# Patient Record
Sex: Male | Born: 1980 | Race: Black or African American | Hispanic: No | Smoking: Current every day smoker
Health system: Southern US, Community
[De-identification: ages and names within clinical notes are randomized; demographics above are authoritative.]

## PROBLEM LIST (undated history)

## (undated) DIAGNOSIS — B20 Human immunodeficiency virus [HIV] disease: Secondary | ICD-10-CM

## (undated) DIAGNOSIS — I10 Essential (primary) hypertension: Secondary | ICD-10-CM

## (undated) DIAGNOSIS — Z21 Asymptomatic human immunodeficiency virus [HIV] infection status: Secondary | ICD-10-CM

## (undated) HISTORY — DX: Essential (primary) hypertension: I10

## (undated) HISTORY — DX: Asymptomatic human immunodeficiency virus (hiv) infection status: Z21

## (undated) HISTORY — DX: Human immunodeficiency virus (HIV) disease: B20

## (undated) HISTORY — PX: CHOLECYSTECTOMY: SHX55

---

## 2021-03-02 ENCOUNTER — Other Ambulatory Visit: Payer: Self-pay

## 2021-03-02 ENCOUNTER — Emergency Department (HOSPITAL_BASED_OUTPATIENT_CLINIC_OR_DEPARTMENT_OTHER): Payer: 59

## 2021-03-02 ENCOUNTER — Emergency Department (HOSPITAL_BASED_OUTPATIENT_CLINIC_OR_DEPARTMENT_OTHER)
Admission: EM | Admit: 2021-03-02 | Discharge: 2021-03-02 | Disposition: A | Discharge status: Home / Self Care | Payer: 59 | Attending: Emergency Medicine | Admitting: Emergency Medicine

## 2021-03-02 ENCOUNTER — Encounter (HOSPITAL_BASED_OUTPATIENT_CLINIC_OR_DEPARTMENT_OTHER): Payer: Self-pay

## 2021-03-02 DIAGNOSIS — K5229 Other allergic and dietetic gastroenteritis and colitis: Secondary | ICD-10-CM | POA: Insufficient documentation

## 2021-03-02 DIAGNOSIS — F17210 Nicotine dependence, cigarettes, uncomplicated: Secondary | ICD-10-CM | POA: Diagnosis not present

## 2021-03-02 DIAGNOSIS — Z9102 Food additives allergy status: Secondary | ICD-10-CM | POA: Diagnosis not present

## 2021-03-02 DIAGNOSIS — K6289 Other specified diseases of anus and rectum: Secondary | ICD-10-CM | POA: Diagnosis present

## 2021-03-02 DIAGNOSIS — K529 Noninfective gastroenteritis and colitis, unspecified: Secondary | ICD-10-CM

## 2021-03-02 LAB — CBC WITH DIFFERENTIAL/PLATELET
Abs Immature Granulocytes: 0.02 10*3/uL (ref 0.00–0.07)
Basophils Absolute: 0 10*3/uL (ref 0.0–0.1)
Basophils Relative: 0 %
Eosinophils Absolute: 0.1 10*3/uL (ref 0.0–0.5)
Eosinophils Relative: 1 %
HCT: 42.5 % (ref 39.0–52.0)
Hemoglobin: 14.8 g/dL (ref 13.0–17.0)
Immature Granulocytes: 0 %
Lymphocytes Relative: 34 %
Lymphs Abs: 2.3 10*3/uL (ref 0.7–4.0)
MCH: 30.3 pg (ref 26.0–34.0)
MCHC: 34.8 g/dL (ref 30.0–36.0)
MCV: 87.1 fL (ref 80.0–100.0)
Monocytes Absolute: 0.6 10*3/uL (ref 0.1–1.0)
Monocytes Relative: 9 %
Neutro Abs: 3.8 10*3/uL (ref 1.7–7.7)
Neutrophils Relative %: 56 %
Platelets: 177 10*3/uL (ref 150–400)
RBC: 4.88 MIL/uL (ref 4.22–5.81)
RDW: 12.3 % (ref 11.5–15.5)
WBC: 6.8 10*3/uL (ref 4.0–10.5)
nRBC: 0 % (ref 0.0–0.2)

## 2021-03-02 LAB — COMPREHENSIVE METABOLIC PANEL
ALT: 44 U/L (ref 0–44)
AST: 27 U/L (ref 15–41)
Albumin: 4.3 g/dL (ref 3.5–5.0)
Alkaline Phosphatase: 68 U/L (ref 38–126)
Anion gap: 7 (ref 5–15)
BUN: 10 mg/dL (ref 6–20)
CO2: 28 mmol/L (ref 22–32)
Calcium: 9.4 mg/dL (ref 8.9–10.3)
Chloride: 102 mmol/L (ref 98–111)
Creatinine, Ser: 1.33 mg/dL — ABNORMAL HIGH (ref 0.61–1.24)
GFR, Estimated: >60 mL/min (ref >60)
Glucose, Bld: 98 mg/dL (ref 70–99)
Potassium: 3.9 mmol/L (ref 3.5–5.1)
Sodium: 137 mmol/L (ref 135–145)
Total Bilirubin: 1.4 mg/dL — ABNORMAL HIGH (ref 0.3–1.2)
Total Protein: 7.6 g/dL (ref 6.5–8.1)

## 2021-03-02 MED ORDER — DOXYCYCLINE HYCLATE 100 MG PO CAPS: 100.0000 mg | ORAL_CAPSULE | Freq: Two times a day (BID) | ORAL | 0 refills | Status: AC

## 2021-03-02 MED ORDER — SODIUM CHLORIDE 0.9 % IV BOLUS
500.0000 mL | Freq: Once | INTRAVENOUS | Status: AC
  Administered 2021-03-02: 500 mL via INTRAVENOUS

## 2021-03-02 MED ORDER — IOHEXOL 300 MG/ML  SOLN
100.0000 mL | Freq: Once | INTRAMUSCULAR | Status: AC | PRN
  Administered 2021-03-02: 100 mL via INTRAVENOUS

## 2021-03-02 NOTE — ED Notes (Signed)
Patient transported to CT 

## 2021-03-02 NOTE — ED Provider Notes (Signed)
Emergency Department Provider Note   I have reviewed the triage vital signs and the nursing notes.   HISTORY  Chief Complaint Rectal Pain   HPI Stanley Schwartz is a 40 y.o. male presents to the emergency department with rectal pain and discharge after recent sexual encounter.  Patient states that he has had issues with rectal STDs in the past.  He thought that was the issue after recent encounter and went to urgent care where he was treated with Rocephin and doxycycline.  Has been taking the doxycycline for 2 days but continues to have pain where he would typically expect symptoms to have started to resolve by now.  He states he is having some discharge and inability to pass a bowel movement although feels like he needs to.  He denies any pain into his abdomen.  No vomiting.  No fevers.  With symptoms not progressing the way he expects he presents to the ED for evaluation.   History reviewed. No pertinent past medical history.  There are no problems to display for this patient.   Past Surgical History:  Procedure Laterality Date   CHOLECYSTECTOMY      Allergies Amoxicillin  History reviewed. No pertinent family history.  Social History Social History   Tobacco Use   Smoking status: Every Day    Pack years: 0.00    Types: Cigarettes   Smokeless tobacco: Never  Vaping Use   Vaping Use: Never used  Substance Use Topics   Alcohol use: Yes    Comment: socially   Drug use: Never    Review of Systems  Constitutional: No fever/chills Eyes: No visual changes. ENT: No sore throat. Cardiovascular: Denies chest pain. Respiratory: Denies shortness of breath. Gastrointestinal: No abdominal pain.  No nausea, no vomiting.  No diarrhea.  No constipation. Positive rectal pain and discharge.  Genitourinary: Negative for dysuria. Musculoskeletal: Negative for back pain. Skin: Negative for rash. Neurological: Negative for headaches, focal weakness or numbness.  10-point ROS  otherwise negative.  ____________________________________________   PHYSICAL EXAM:  VITAL SIGNS: ED Triage Vitals  Enc Vitals Group     BP 03/02/21 1839 (!) 185/113     Pulse Rate 03/02/21 1839 88     Resp 03/02/21 1839 18     Temp 03/02/21 1839 99.3 F (37.4 C)     Temp Source 03/02/21 1839 Oral     SpO2 03/02/21 1839 100 %     Weight 03/02/21 1838 166 lb (75.3 kg)     Height 03/02/21 1838 5\' 7"  (1.702 m)   Constitutional: Alert and oriented. Well appearing and in no acute distress. Eyes: Conjunctivae are normal. Head: Atraumatic. Nose: No congestion/rhinnorhea. Mouth/Throat: Mucous membranes are moist.   Neck: No stridor.   Cardiovascular: Normal rate, regular rhythm. Good peripheral circulation. Grossly normal heart sounds.   Respiratory: Normal respiratory effort.  No retractions. Lungs CTAB. Gastrointestinal: Soft and nontender. No distention.  Rectal exam performed with patient consent.  No outward sign of trauma, discharge, bleeding.  No hemorrhoids. Musculoskeletal: No lower extremity tenderness nor edema. No gross deformities of extremities. Neurologic:  Normal speech and language. No gross focal neurologic deficits are appreciated.  Skin:  Skin is warm, dry and intact. No rash noted.  ____________________________________________   LABS (all labs ordered are listed, but only abnormal results are displayed)  Labs Reviewed  COMPREHENSIVE METABOLIC PANEL - Abnormal; Notable for the following components:      Result Value   Creatinine, Ser 1.33 (*)  Total Bilirubin 1.4 (*)    All other components within normal limits  RPR - Abnormal; Notable for the following components:   RPR Ser Ql Reactive (*)    All other components within normal limits  CBC WITH DIFFERENTIAL/PLATELET  HIV ANTIBODY (ROUTINE TESTING W REFLEX)  T.PALLIDUM AB, TOTAL    ____________________________________________  RADIOLOGY  CT pelvis with contrast reviewed. No abscess or sign of  perforation.   ____________________________________________   PROCEDURES  Procedure(s) performed:   Procedures  None  ____________________________________________   INITIAL IMPRESSION / ASSESSMENT AND PLAN / ED COURSE  Pertinent labs & imaging results that were available during my care of the patient were reviewed by me and considered in my medical decision making (see chart for details).   Patient presents to the emergency department with rectal pain and discharge.  Clinically seems most consistent with proctocolitis likely related to rectal STD.  He had swabs performed at urgent care 2 days ago and those results are pending.  He is continuing to have pain although has only been on antibiotics for 2 days.  CT pelvis with contrast performed looking for sign of perirectal abscess or small perforation.  The CT is consistent with proctocolitis without complication.  Per CDC guidance, with some purulent drainage the recommendation is to continue doxycycline for 21 days as opposed to 7.  Wrote a prescription for additional 14 days.  Do not feel patient's been taking antibiotics Latasha Buczkowski enough to consider this a failure.  HIV and RPR testing sent as well.    ____________________________________________  FINAL CLINICAL IMPRESSION(S) / ED DIAGNOSES  Final diagnoses:  Proctocolitis     MEDICATIONS GIVEN DURING THIS VISIT:  Medications  sodium chloride 0.9 % bolus 500 mL ( Intravenous Stopped 03/02/21 2115)  iohexol (OMNIPAQUE) 300 MG/ML solution 100 mL (100 mLs Intravenous Contrast Given 03/02/21 2039)     NEW OUTPATIENT MEDICATIONS STARTED DURING THIS VISIT:  Discharge Medication List as of 03/02/2021  9:36 PM     START taking these medications   Details  doxycycline (VIBRAMYCIN) 100 MG capsule Take 1 capsule (100 mg total) by mouth 2 (two) times daily for 14 days., Starting Thu 03/02/2021, Until Thu 03/16/2021, Print        Note:  This document was prepared using Dragon voice  recognition software and may include unintentional dictation errors.  Alona Bene, MD, [facility] Emergency Medicine    Ivonna Kinnick, Arlyss Repress, MD 03/04/21 [phone]

## 2021-03-02 NOTE — Discharge Instructions (Addendum)
You were seen in the ED with rectal inflammation. I will have you continue your antibiotics for the next week. If your symptoms continue, I have filled your prescription for Doxycycline for an additional 2 weeks to take. Please follow closely with your PCP.

## 2021-03-02 NOTE — ED Triage Notes (Signed)
Pt c/o rectal pain after anal intercourse on Sunday. States has had a lot of pressure and hasnt had BM since. States was only able to expel mucus. Went to UC and was given abx. Frequently tested for STD as he is "highly sexual". Denies abdominal pain

## 2021-03-03 LAB — RPR
RPR Ser Ql: REACTIVE — AB
RPR Titer: 1:1 {titer}

## 2021-03-03 LAB — HIV ANTIBODY (ROUTINE TESTING W REFLEX): HIV Screen 4th Generation wRfx: NONREACTIVE

## 2021-03-06 LAB — T.PALLIDUM AB, TOTAL: T Pallidum Abs: REACTIVE — AB

## 2022-06-27 IMAGING — CT CT PELVIS W/ CM
2 of 3 series · 16 of 46 positions shown, 18 images · IV contrast (Omnipaque)
Comparison: None.

CLINICAL DATA: Rectal pain

EXAM:
CT PELVIS WITH CONTRAST
TECHNIQUE: Multidetector CT imaging of the pelvis was performed using the
standard protocol following the bolus administration of intravenous
contrast.
CONTRAST:  100mL OMNIPAQUE IOHEXOL 300 MG/ML  SOLN

[Series 2: axial soft · axial · 0.90mm/px · z∈[-641,-383]mm · 13 of 149 slices shown, 15 images]
[im 10/149  soft-tissue]
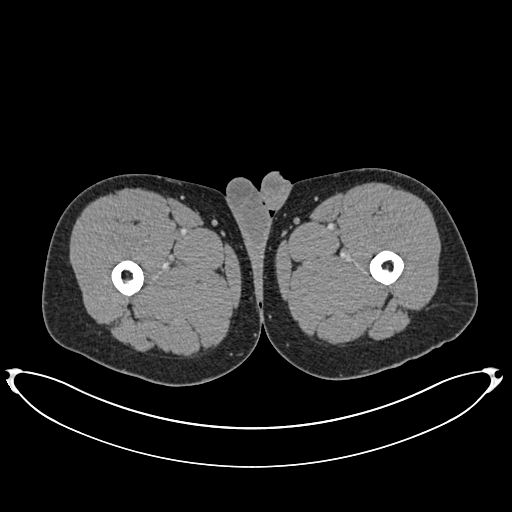
[im 10/149  bone]
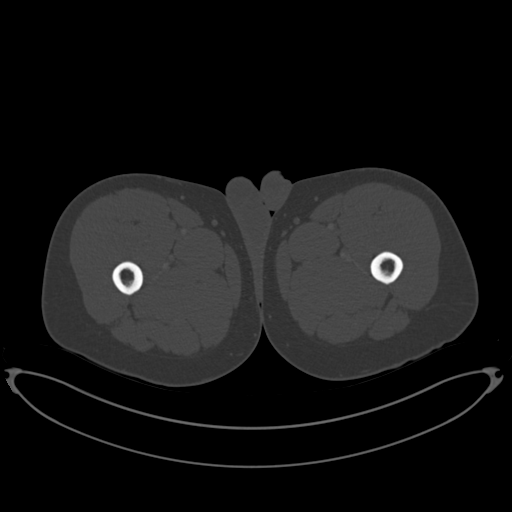
[im 20/149  soft-tissue]
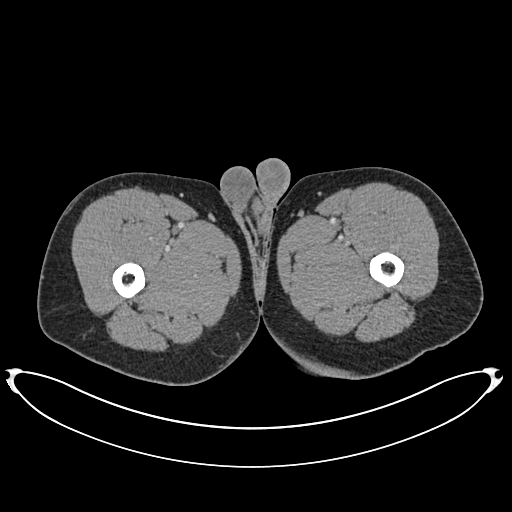
[im 29/149  soft-tissue]
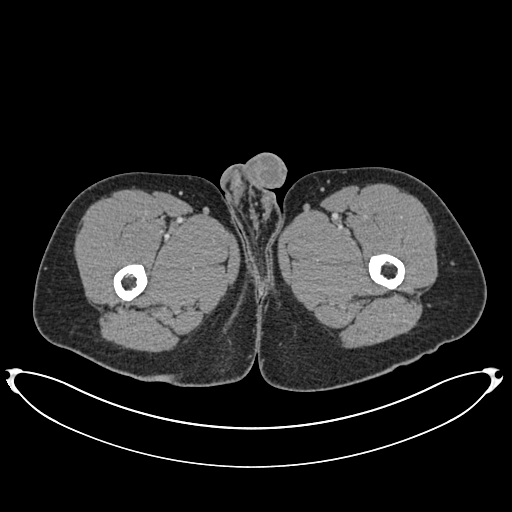
[im 43/149  soft-tissue]
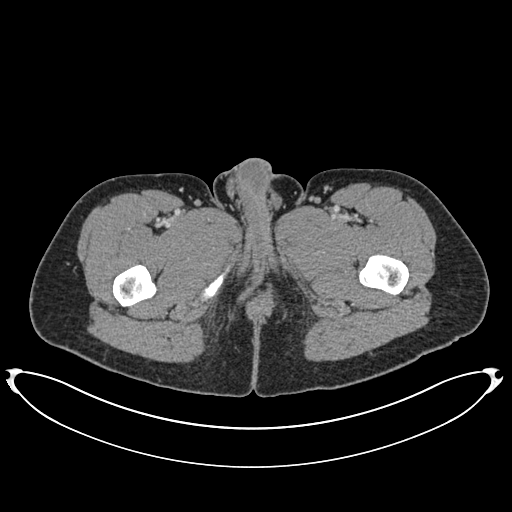
[im 53/149  soft-tissue]
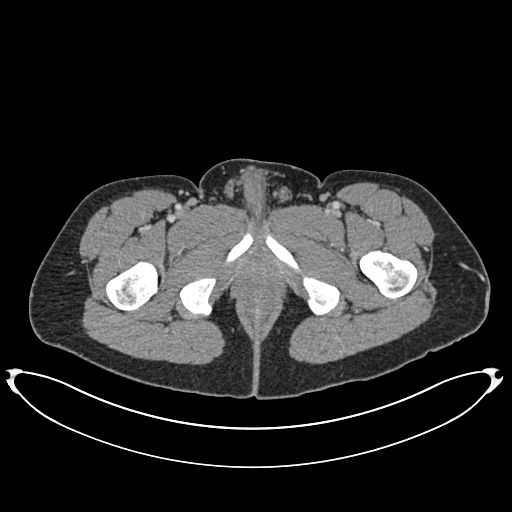
[im 63/149  soft-tissue]
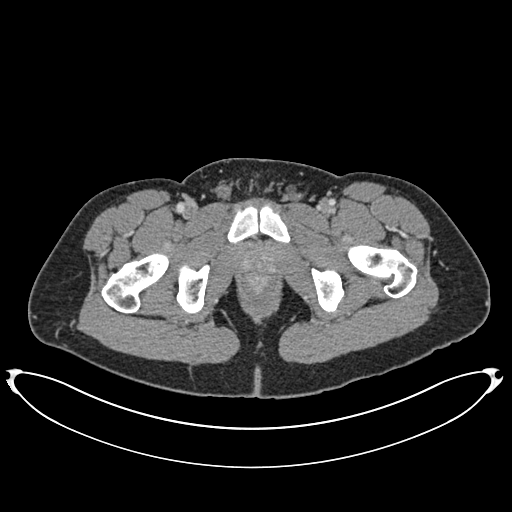
[im 77/149  soft-tissue]
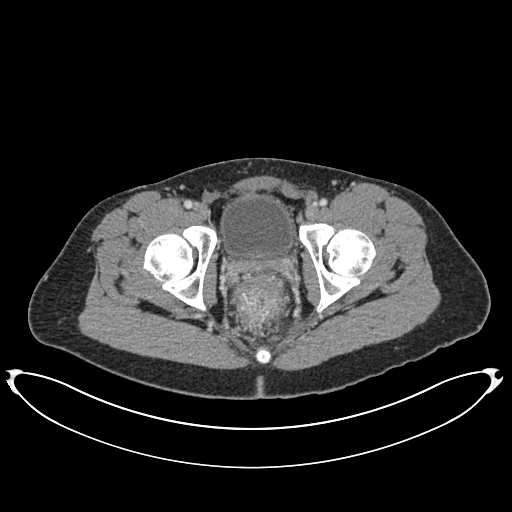
[im 86/149  soft-tissue]
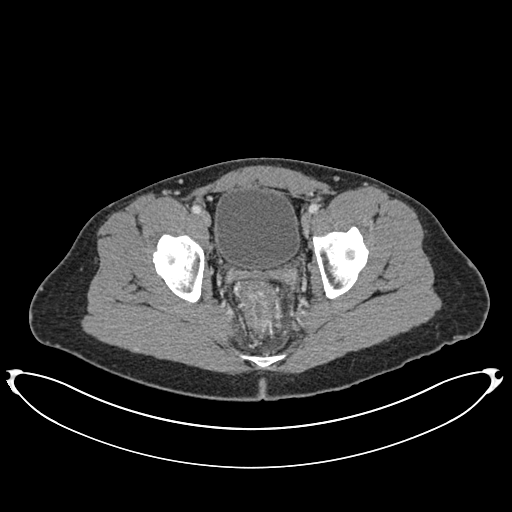
[im 96/149  soft-tissue]
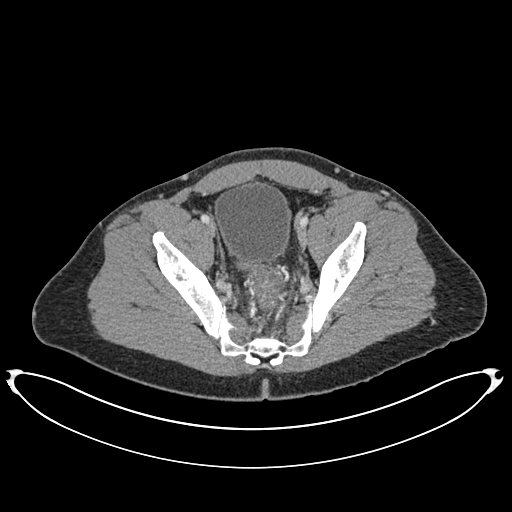
[im 96/149  bone]
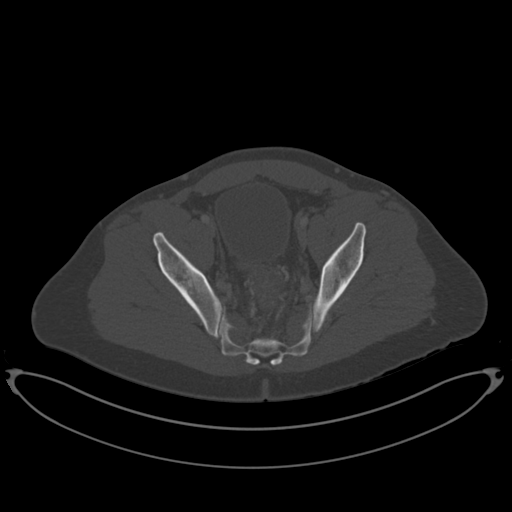
[im 106/149  soft-tissue]
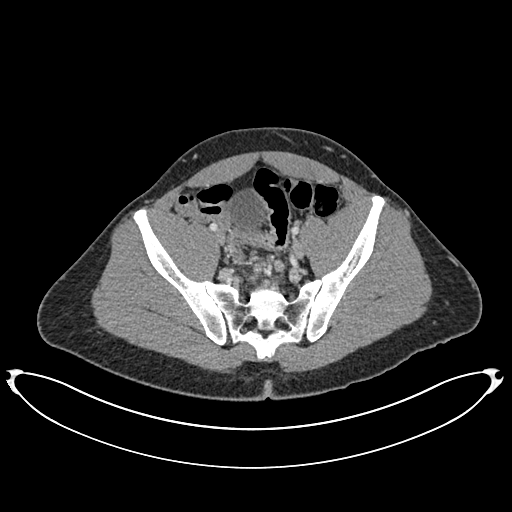
[im 120/149  soft-tissue]
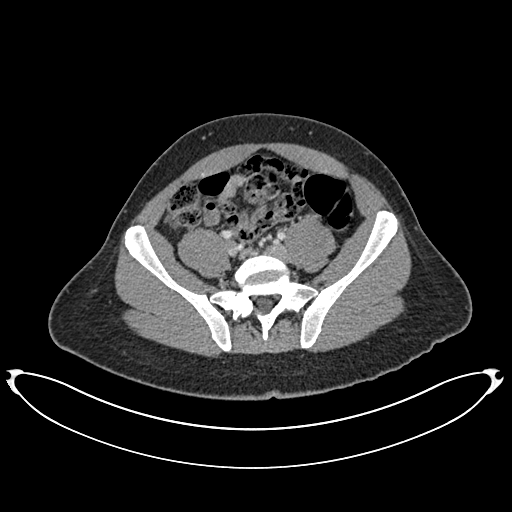
[im 129/149  soft-tissue]
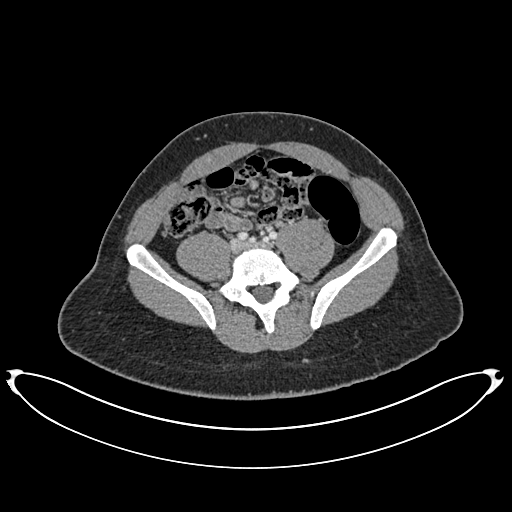
[im 139/149  soft-tissue]
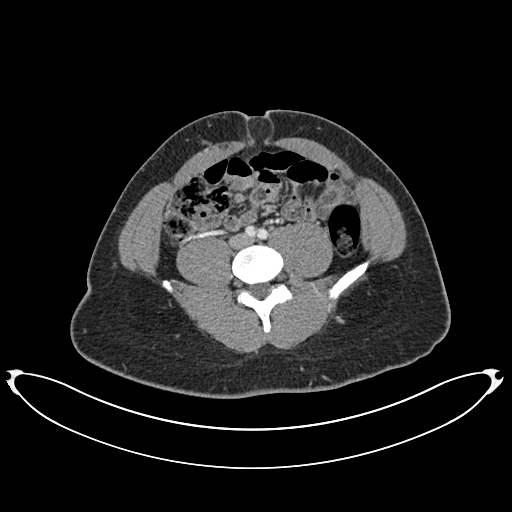

[Series 4: coronal st · coronal · 0.63mm/px · 3 of 140 slices shown]
[im 47/140  soft-tissue]
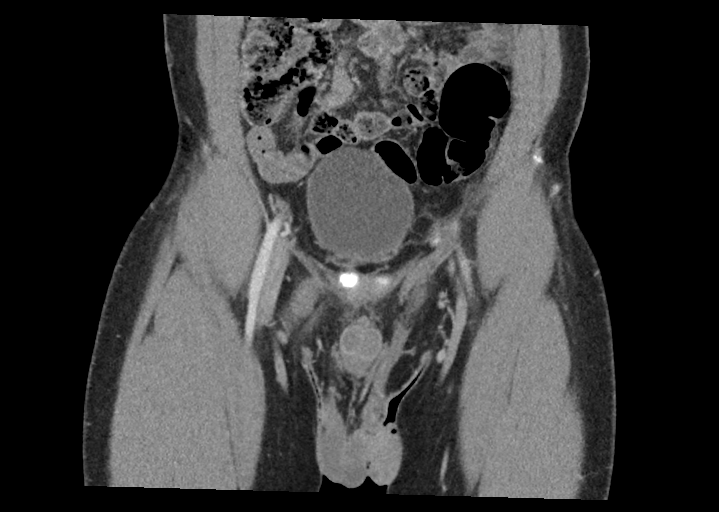
[im 62/140  soft-tissue]
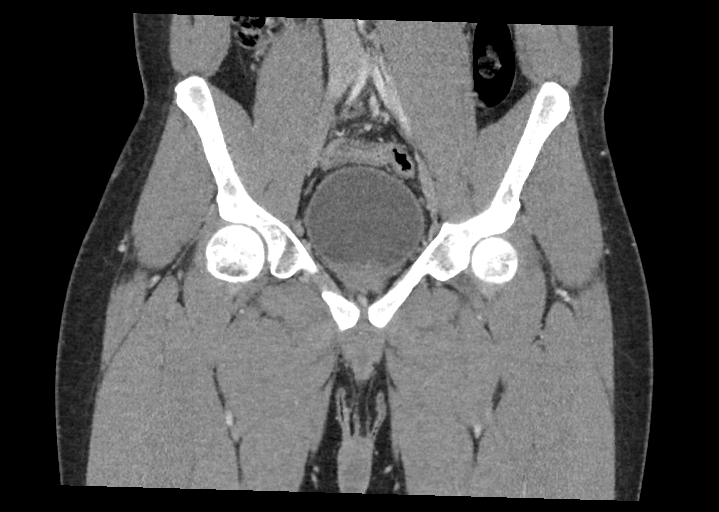
[im 78/140  soft-tissue]
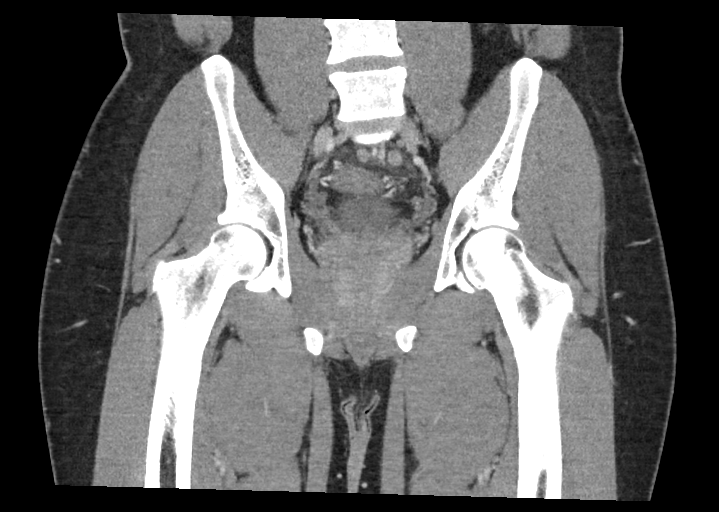

[16 of 46 positions shown; findings below may reference images not displayed]

FINDINGS: Urinary Tract:  No abnormality visualized.

Bowel: Mild sigmoid colon wall thickening. Prominent rectal wall
thickening with inflammation and mucosal enhancement.

Vascular/Lymphatic: No aneurysm. Multiple subcentimeter perirectal
nodes measuring up to 9 mm, series 2, image 45.

Reproductive:  Negative

Other: Negative for free air. Presacral soft tissue stranding and
trace fluid. Small fat containing umbilical hernia.

Musculoskeletal: No suspicious bone lesions identified.
IMPRESSION: Wall thickening, inflammatory change and mucosal enhancement
involving the rectosigmoid colon consistent with proctocolitis. No
definitive perianal or perirectal abscess is seen. There is no
extraluminal gas to suggest a perforation at this time. There are
multiple small perirectal lymph nodes which are probably reactive.

## 2022-09-27 DIAGNOSIS — R5383 Other fatigue: Secondary | ICD-10-CM | POA: Diagnosis not present

## 2022-09-27 DIAGNOSIS — B349 Viral infection, unspecified: Secondary | ICD-10-CM | POA: Diagnosis not present

## 2022-09-27 DIAGNOSIS — H6691 Otitis media, unspecified, right ear: Secondary | ICD-10-CM | POA: Diagnosis not present

## 2022-09-27 DIAGNOSIS — R509 Fever, unspecified: Secondary | ICD-10-CM | POA: Diagnosis not present

## 2022-09-27 DIAGNOSIS — R519 Headache, unspecified: Secondary | ICD-10-CM | POA: Diagnosis not present

## 2022-11-16 ENCOUNTER — Other Ambulatory Visit (HOSPITAL_COMMUNITY): Payer: Self-pay

## 2022-11-16 ENCOUNTER — Telehealth: Payer: Self-pay

## 2022-11-16 NOTE — Telephone Encounter (Signed)
[facility] Patient Teacher, English as a foreign language completed.    The patient is insured through [employer].  Prescription will need to be filled at [facility].  We will continue to follow to see if copay assistance is needed.  Stanley Schwartz, [facility] Patient [facility] for Infectious Disease Phone: [phone] Fax:  [phone]

## 2022-11-19 ENCOUNTER — Encounter: Payer: Self-pay | Admitting: Infectious Diseases

## 2022-11-19 ENCOUNTER — Ambulatory Visit: Payer: Self-pay

## 2022-11-19 ENCOUNTER — Ambulatory Visit: Payer: Self-pay | Admitting: Pharmacist

## 2022-11-20 ENCOUNTER — Ambulatory Visit (INDEPENDENT_AMBULATORY_CARE_PROVIDER_SITE_OTHER): Payer: BC Managed Care – PPO | Admitting: Pharmacist

## 2022-11-20 ENCOUNTER — Ambulatory Visit: Payer: Self-pay

## 2022-11-20 ENCOUNTER — Encounter: Payer: Self-pay | Admitting: Infectious Diseases

## 2022-11-20 ENCOUNTER — Other Ambulatory Visit (HOSPITAL_COMMUNITY): Payer: Self-pay

## 2022-11-20 ENCOUNTER — Ambulatory Visit (INDEPENDENT_AMBULATORY_CARE_PROVIDER_SITE_OTHER): Payer: BC Managed Care – PPO | Admitting: Infectious Diseases

## 2022-11-20 ENCOUNTER — Other Ambulatory Visit: Payer: Self-pay

## 2022-11-20 VITALS — BP 137/86 | HR 84 | Temp 98.0°F | Ht 67.0 in | Wt 175.0 lb

## 2022-11-20 DIAGNOSIS — Z8619 Personal history of other infectious and parasitic diseases: Secondary | ICD-10-CM | POA: Diagnosis not present

## 2022-11-20 DIAGNOSIS — Z21 Asymptomatic human immunodeficiency virus [HIV] infection status: Secondary | ICD-10-CM | POA: Diagnosis not present

## 2022-11-20 DIAGNOSIS — Z8659 Personal history of other mental and behavioral disorders: Secondary | ICD-10-CM

## 2022-11-20 DIAGNOSIS — I10 Essential (primary) hypertension: Secondary | ICD-10-CM

## 2022-11-20 MED ORDER — BICTEGRAVIR-EMTRICITAB-TENOFOV 50-200-25 MG PO TABS: 1.0000 | ORAL_TABLET | Freq: Every day | ORAL | 1 refills | Status: DC

## 2022-11-20 MED ORDER — HYDROCHLOROTHIAZIDE 12.5 MG PO TABS: 12.5000 mg | ORAL_TABLET | Freq: Every day | ORAL | 0 refills | Status: DC

## 2022-11-20 MED ORDER — AMLODIPINE BESYLATE 10 MG PO TABS: 10.0000 mg | ORAL_TABLET | Freq: Every day | ORAL | 0 refills | Status: DC

## 2022-11-20 NOTE — Patient Instructions (Signed)
   Please sign up with MyChart to access your labs and set up email communication with our clinic for non-urgent medical concerns.  Activation Code is good through April - [id number]

## 2022-11-20 NOTE — Progress Notes (Signed)
NEW REFERRAL TO CPP CLINIC    HPI: Stanley Schwartz is a 42 y.o. male who presents to the [facility] clinic for rapid initiation of ART for newly diagnosed HIV infection.  There are no problems to display for this patient.   Patient's Medications  New Prescriptions   No medications on file  Previous Medications   AMLODIPINE (NORVASC) 10 MG TABLET    Take 1 tablet (10 mg total) by mouth daily.   BICTEGRAVIR-EMTRICITABINE-TENOFOVIR AF (BIKTARVY) 50-200-25 MG TABS TABLET    Take 1 tablet by mouth daily. Try to take at the same time each day with or without food.   HYDROCHLOROTHIAZIDE (HYDRODIURIL) 12.5 MG TABLET    Take 1 tablet (12.5 mg total) by mouth daily.  Modified Medications   No medications on file  Discontinued Medications   No medications on file    Allergies: Allergies  Allergen Reactions   Amoxicillin Itching    Past Medical History: No past medical history on file.  Social History: Social History   Socioeconomic History   Marital status: Legally Separated    Spouse name: Not on file   Number of children: Not on file   Years of education: Not on file   Highest education level: Not on file  Occupational History   Not on file  Tobacco Use   Smoking status: Every Day    Packs/day: 0.60    Years: 3.00    Total pack years: 1.80    Types: Cigarettes    Start date: 09/18/2019   Smokeless tobacco: Never  Vaping Use   Vaping Use: Never used  Substance and Sexual Activity   Alcohol use: Yes    Comment: socially   Drug use: Never   Sexual activity: Yes  Other Topics Concern   Not on file  Social History Narrative   Not on file   Social Determinants of Health   Financial Resource Strain: Not on file  Food Insecurity: Not on file  Transportation Needs: Not on file  Physical Activity: Not on file  Stress: Not on file  Social Connections: Not on file    Labs: No results found for: "HIV1RNAQUANT", "HIV1RNAVL", "CD4TABS"  RPR and STI Lab Results  Component  Value Date   LABRPR Reactive (A) 03/02/2021        No data to display          Hepatitis B No results found for: "HEPBSAB", "HEPBSAG", "HEPBCAB" Hepatitis C No results found for: "HEPCAB", "HCVRNAPCRQN" Hepatitis A No results found for: "HAV" Lipids: No results found for: "CHOL", "TRIG", "HDL", "CHOLHDL", "VLDL", "LDLCALC"   Assessment: Stanley Schwartz presents to the clinic to establish care for his newly diagnosed HIV infection. Patient tested positive in February 2024. No labs available yet but will get them drawn today. Medications reviewed; no drug interactions were found. Will start [place].  Explained that Stanley Schwartz is a one pill once daily medication with or without food and the importance of not missing any doses. Explained resistance and how it develops and why it is so important to take Biktarvy daily and not skip days or doses. Counseled patient to take it around the same time each day. Counseled on what to do if dose is missed, if closer to missed dose take immediately, if closer to next dose then skip and resume normal schedule.   Cautioned on possible side effects the first week or so including nausea, diarrhea, dizziness, and headaches but that they should resolve after the first couple of weeks. Counseled  patient to separate Biktarvy from divalent cations including multivitamins. Discussed with patient to call clinic if he starts a new medication or herbal supplement. I gave the patient my card and told him to call me with any issues/questions/concerns.  Biktarvy samples for 2 weeks were provided. He is currently insured and must get his Biktarvy from [place].   Plan: - Biktarvy rapid start - Will follow up on lab work  Gwendolin Briel L. Ocia Simek, PharmD, BCIDP, AAHIVP, CPP Clinical Pharmacist Practitioner Infectious Diseases [place] [facility] for Infectious Disease 11/20/2022, 11:47 AM

## 2022-11-20 NOTE — Progress Notes (Signed)
   Name: Stanley Schwartz  DOB: 09/12/1981 MRN: [id number] PCP: Pcp, No    There are no problems to display for this patient.    Brief Narrative:  Stanley Schwartz is a 42 y.o. male with HIV, Dx 11/01/2022 during routine STI visit at [facility]. Previously on PrEP (T[place]vada >> Descovy) then was not an option d/t insurance decision  CD4 nadir *** VL *** HIV Risk: *** History of OIs: *** Intake Labs ***: Hep B sAg (***), sAb (***), cAb (***); Hep A (***), Hep C (***) Quantiferon (***) HLA B*5701 (***) G6PD: (***)   Previous Regimens: ***  Genotypes: ***  Subjective:   Chief Complaint  Patient presents with  . New Patient (Initial Visit)       HPI: Stanley Schwartz is a 42 y.o. male here today for first visit to discuss HIV treatment.   Was on Descovy when he found out he was HIV (+)  Had a 2 week history   Is taking on HTN medications - Amlodipine and Hydrodil but has been out   H/O Syphilis in the past - was treated about 1 year ago. Recent titer in February 2024 1:2.  ROS  No past medical history on file.  Outpatient Medications Prior to Visit  Medication Sig Dispense Refill  . amLODipine (NORVASC) 10 MG tablet Take 10 mg by mouth daily.    . hydrochlorothiazide (HYDRODIURIL) 12.5 MG tablet Take 12.5 mg by mouth daily.     No facility-administered medications prior to visit.     Allergies  Allergen Reactions  . Amoxicillin Itching    Social History   Tobacco Use  . Smoking status: Every Day    Types: Cigarettes  . Smokeless tobacco: Never  Vaping Use  . Vaping Use: Never used  Substance Use Topics  . Alcohol use: Yes    Comment: socially  . Drug use: Never    No family history on file.  Social History   Substance and Sexual Activity  Sexual Activity Yes     Objective:   Vitals:   11/20/22 0919  BP: 137/86  Pulse: 84  Temp: 98 F (36.7 C)  TempSrc: Temporal  SpO2: 100%  Weight: 175 lb (79.4 kg)  Height: '5\' 7"'$  (1.702 m)   Body mass index is 27.41  kg/m.  Physical Exam  Lab Results Lab Results  Component Value Date   WBC 6.8 03/02/2021   HGB 14.8 03/02/2021   HCT 42.5 03/02/2021   MCV 87.1 03/02/2021   PLT 177 03/02/2021    Lab Results  Component Value Date   CREATININE 1.33 (H) 03/02/2021   BUN 10 03/02/2021   NA 137 03/02/2021   K 3.9 03/02/2021   CL 102 03/02/2021   CO2 28 03/02/2021    Lab Results  Component Value Date   ALT 44 03/02/2021   AST 27 03/02/2021   ALKPHOS 68 03/02/2021   BILITOT 1.4 (H) 03/02/2021    No results found for: "CHOL", "HDL", "LDLCALC", "LDLDIRECT", "TRIG", "CHOLHDL" No results found for: "HIV1RNAQUANT", "HIV1RNAVL", "CD4TABS"   Assessment & Plan:   Problem List Items Addressed This Visit   None   Janene Madeira, MSN, NP-C [facility] Pager: [phone] Office: [phone]  11/20/22  9:27 AM

## 2022-11-21 ENCOUNTER — Encounter: Payer: Self-pay | Admitting: Infectious Diseases

## 2022-11-21 ENCOUNTER — Telehealth: Payer: Self-pay | Admitting: Pharmacist

## 2022-11-21 DIAGNOSIS — B20 Human immunodeficiency virus [HIV] disease: Secondary | ICD-10-CM | POA: Insufficient documentation

## 2022-11-21 DIAGNOSIS — I10 Essential (primary) hypertension: Secondary | ICD-10-CM | POA: Insufficient documentation

## 2022-11-21 DIAGNOSIS — Z21 Asymptomatic human immunodeficiency virus [HIV] infection status: Secondary | ICD-10-CM | POA: Insufficient documentation

## 2022-11-21 DIAGNOSIS — Z8659 Personal history of other mental and behavioral disorders: Secondary | ICD-10-CM | POA: Insufficient documentation

## 2022-11-21 DIAGNOSIS — Z8619 Personal history of other infectious and parasitic diseases: Secondary | ICD-10-CM

## 2022-11-21 HISTORY — DX: Personal history of other infectious and parasitic diseases: Z86.19

## 2022-11-21 LAB — T-HELPER CELLS (CD4) COUNT (NOT AT ARMC)
CD4 % Helper T Cell: 44 % (ref 33–65)
CD4 T Cell Abs: 534 /uL (ref 400–1790)

## 2022-11-21 MED ORDER — BICTEGRAVIR-EMTRICITAB-TENOFOV 50-200-25 MG PO TABS: 1.0000 | ORAL_TABLET | Freq: Every day | ORAL | 0 refills | Status: AC

## 2022-11-21 NOTE — Assessment & Plan Note (Signed)
BP Readings from Last 3 Encounters:  11/20/22 137/86  03/02/21 138/90   Provided him with a refill of amlodipine 10 mg HCTZ 12.5 mg tablets.  He will follow-up on further fills with his primary care team. Tobacco cessation recommended to help with BP control as well.  He is very interested in this and we can talk again at upcoming visits if he needs help/treatment to achieve this.

## 2022-11-21 NOTE — Assessment & Plan Note (Signed)
Recently had comprehensive STI check at the end of February with RPR titer 1:2.  I am not quite sure what his original treatment titer was from 1 year ago.  Will continue to monitor motor 2 times a year for possible reinfection or more frequently if clinically indicated.

## 2022-11-21 NOTE — Assessment & Plan Note (Signed)
New patient here to establish for HIV care.  Recently tested positive for HIV-1 in February 2024 with last negative screening test and December 2023.  Symptoms of what sounds like acute retroviral syndrome in January of this year indicating recent acute infection.  I discussed with Harrel Lemon treatment options/side effects, benefits of treatment and long-term outcomes. I discussed how HIV is transmitted and the process of untreated HIV including increased risk for opportunistic infections, cancer, dementia and renal failure. Patient was counseled on routine HIV care including medication adherence, blood monitoring, necessary vaccines and follow up visits. Counseled regarding safe sex practices including: condom use, partner disclosure if legally implicated, limiting partners. Patient spent time talking with our pharmacist Cassie regarding successful practices of ART and understands to reach out to our clinic in the future with questions.   Will start [place] for HIV treatment.  It sounds like he had a overlap before he was taking Descovy in the setting of acute HIV.  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  Ultimately he is interested in transitioning to long-acting injectable Patient has insurance and co-pay assistance was discussed today.  Medication sent to specialty pharmacy.  General introduction to our clinic and integrated services. No needs identified today. Next visit we can discuss dental referra and begin immunization recommendations.  Will also discuss and offer rectal cancer screening with anal pap smear.  Will see if we can obtain records for previous HPV administrations as he is unsure specifically that vaccination history.  Pertinent labs ordered today including basic chemistry and CBC, hepatitis serologies RPR.  Had comprehensive STI check  Will return to clinic in 4-6  weeks to review lab work and continue HIV education.

## 2022-11-21 NOTE — Assessment & Plan Note (Signed)
Stanley Schwartz has a history of depression not currently active or being treated.  He was on antidepressants for about 2 years and weaned off.  He reports his mood is doing well me updated if there is any changes.

## 2022-11-21 NOTE — Telephone Encounter (Signed)
Medication Samples have been provided to the patient.  Drug name: Biktarvy        Strength: 50/200/25 mg       Qty: 14 tablets (2 bottles) LOT: CPBDCA   Exp.Date: 3/26  Dosing instructions: Take one tablet by mouth once daily  The patient has been instructed regarding the correct time, dose, and frequency of taking this medication, including desired effects and most common side effects.   Alfonse Spruce, PharmD, CPP, BCIDP, [place] Clinical Pharmacist Practitioner Infectious [place] for Infectious Disease

## 2022-11-26 ENCOUNTER — Encounter: Payer: Self-pay | Admitting: Infectious Diseases

## 2022-12-06 LAB — COMPLETE METABOLIC PANEL WITH GFR
AG Ratio: 1.8 (calc) (ref 1.0–2.5)
ALT: 45 U/L (ref 9–46)
AST: 25 U/L (ref 10–40)
Albumin: 4.5 g/dL (ref 3.6–5.1)
Alkaline phosphatase (APISO): 49 U/L (ref 36–130)
BUN: 16 mg/dL (ref 7–25)
CO2: 28 mmol/L (ref 20–32)
Calcium: 9.5 mg/dL (ref 8.6–10.3)
Chloride: 106 mmol/L (ref 98–110)
Creat: 1.23 mg/dL (ref 0.60–1.26)
Globulin: 2.5 g/dL (calc) (ref 1.9–3.7)
Glucose, Bld: 87 mg/dL (ref 65–99)
Potassium: 4.6 mmol/L (ref 3.5–5.3)
Sodium: 141 mmol/L (ref 135–146)
Total Bilirubin: 1 mg/dL (ref 0.2–1.2)
Total Protein: 7 g/dL (ref 6.1–8.1)
eGFR: 77 mL/min/{1.73_m2} (ref >=60)

## 2022-12-06 LAB — HIV-1 GENOTYPE: HIV-1 Genotype: DETECTED — AB

## 2022-12-06 LAB — HEPATITIS B SURFACE ANTIGEN: Hepatitis B Surface Ag: NONREACTIVE

## 2022-12-06 LAB — QUANTIFERON-TB GOLD PLUS
Mitogen-NIL: >10 IU/mL
NIL: 0.09 IU/mL
QuantiFERON-TB Gold Plus: NEGATIVE
TB1-NIL: 0.01 IU/mL
TB2-NIL: <0 IU/mL

## 2022-12-06 LAB — CBC
HCT: 43.2 % (ref 38.5–50.0)
Hemoglobin: 14.6 g/dL (ref 13.2–17.1)
MCH: 30.5 pg (ref 27.0–33.0)
MCHC: 33.8 g/dL (ref 32.0–36.0)
MCV: 90.2 fL (ref 80.0–100.0)
MPV: 10.4 fL (ref 7.5–12.5)
Platelets: 206 10*3/uL (ref 140–400)
RBC: 4.79 10*6/uL (ref 4.20–5.80)
RDW: 12.8 % (ref 11.0–15.0)
WBC: 6.7 10*3/uL (ref 3.8–10.8)

## 2022-12-06 LAB — RPR: RPR Ser Ql: REACTIVE — AB

## 2022-12-06 LAB — HIV RNA, RTPCR W/R GT (RTI, PI,INT)
HIV 1 RNA Quant: 4440 copies/mL — ABNORMAL HIGH
HIV-1 RNA Quant, Log: 3.65 Log copies/mL — ABNORMAL HIGH

## 2022-12-06 LAB — RPR TITER: RPR Titer: 1:1 {titer} — ABNORMAL HIGH

## 2022-12-06 LAB — T PALLIDUM AB: T Pallidum Abs: POSITIVE — AB

## 2022-12-06 LAB — HIV-1 INTEGRASE GENOTYPE

## 2022-12-06 LAB — HEPATITIS B SURFACE ANTIBODY,QUALITATIVE: Hep B S Ab: REACTIVE — AB

## 2022-12-17 ENCOUNTER — Other Ambulatory Visit: Payer: Self-pay | Admitting: Infectious Diseases

## 2022-12-27 ENCOUNTER — Other Ambulatory Visit: Payer: Self-pay

## 2022-12-27 ENCOUNTER — Other Ambulatory Visit (HOSPITAL_COMMUNITY)
Admission: RE | Admit: 2022-12-27 | Discharge: 2022-12-27 | Disposition: A | Discharge status: Home / Self Care | Payer: BC Managed Care – PPO | Source: Ambulatory Visit | Attending: Infectious Diseases | Admitting: Infectious Diseases

## 2022-12-27 ENCOUNTER — Ambulatory Visit: Payer: BC Managed Care – PPO | Admitting: Infectious Diseases

## 2022-12-27 ENCOUNTER — Encounter: Payer: Self-pay | Admitting: Infectious Diseases

## 2022-12-27 VITALS — BP 131/88 | HR 70 | Temp 98.5°F | Wt 178.0 lb

## 2022-12-27 DIAGNOSIS — Z113 Encounter for screening for infections with a predominantly sexual mode of transmission: Secondary | ICD-10-CM

## 2022-12-27 DIAGNOSIS — R198 Other specified symptoms and signs involving the digestive system and abdomen: Secondary | ICD-10-CM | POA: Diagnosis not present

## 2022-12-27 DIAGNOSIS — Z21 Asymptomatic human immunodeficiency virus [HIV] infection status: Secondary | ICD-10-CM | POA: Diagnosis not present

## 2022-12-27 MED ORDER — CEFTRIAXONE SODIUM 500 MG IJ SOLR
500.0000 mg | Freq: Once | INTRAMUSCULAR | Status: AC
  Administered 2022-12-27: 500 mg via INTRAMUSCULAR

## 2022-12-27 NOTE — Assessment & Plan Note (Signed)
[place] with ceftriaxone 500 mg x 1, recommended partner get re-treated then wait 1 week after both are treated for further sex of any kind between them both.  FU oral/rectal and urine testing for further recs if doxy needed.

## 2022-12-27 NOTE — Progress Notes (Signed)
Name: Stanley Schwartz  DOB: [birth year] MRN: [id number] PCP: Pcp, No    Patient Active Problem List   Diagnosis Date Noted   Rectal discharge 12/27/2022   HIV (human immunodeficiency virus infection) 11/21/2022   Hypertension 11/21/2022   History of depression 11/21/2022   History of syphilis 11/21/2022     Brief Narrative:  Stanley Schwartz is a 42 y.o. male with HIV, Dx 11/01/2022 during routine STI visit at [facility]. Previously on PrEP (Truvada >> Descovy) with a lapse by about a few months in 2023 due to insurance changes.  Last negative test was in December 2023 CD4 nadir pending VL pending HIV Risk: Sexual/MSM History of OIs: None Intake Labs 11-2022: Hep B sAg (p), sAb (p), cAb (p); Hep A (p), Hep C (p) Quantiferon (p) HLA B*5701 (p) G6PD: (p)   Previous Regimens: Biktarvy - hx of descovy for PrEP / M184V  Genotypes: Pending 11/2022  Subjective:   Chief Complaint  Patient presents with   Follow-up    Gonorrhea treatment in Feb at [facility]/ rectal itching/ has had unprotected sex since treatment        HPI: Stanley Schwartz is a 42 y.o. male here today for 20m follow up. Has not missed any doses of the Biktarvy. Found the sweet spot moment for taking it daily. No side effects or trouble getting refill so far.   H/O Syphilis in the past - was treated about 1 year ago with injections. Recent titer in February 2024 1:2.  Tx for gonorrhea at [facility] earlier this year. Has had a few new partners, unprotected sex since. More rectal itching/mucous drainage that he doesn't feel has completely resolved. His partner was treated at one point but they may have had intercourse before that was complete. They are exclusive partners.    Review of Systems  Gastrointestinal:        Rectal discharge and discomfort  All other systems reviewed and are negative.    Past Medical History:  Diagnosis Date   History of syphilis 11/21/2022   HIV (human immunodeficiency virus infection)    Hypertension      Outpatient Medications Prior to Visit  Medication Sig Dispense Refill   amLODipine (NORVASC) 10 MG tablet Take 1 tablet (10 mg total) by mouth daily. 30 tablet 0   bictegravir-emtricitabine-tenofovir AF (BIKTARVY) 50-200-25 MG TABS tablet Take 1 tablet by mouth daily. Try to take at the same time each day with or without food. 90 tablet 1   hydrochlorothiazide (HYDRODIURIL) 12.5 MG tablet Take 1 tablet (12.5 mg total) by mouth daily. 30 tablet 0   No facility-administered medications prior to visit.     Allergies  Allergen Reactions   Amoxicillin Itching    Treated with bicillin in 2022/2023 and tolerated well per patient report    Social History   Tobacco Use   Smoking status: Every Day    Packs/day: 0.60    Years: 3.00    Additional pack years: 0.00    Total pack years: 1.80    Types: Cigarettes    Start date: 09/18/2019   Smokeless tobacco: Never  Vaping Use   Vaping Use: Never used  Substance Use Topics   Alcohol use: Yes    Comment: socially   Drug use: Never    Family History  Problem Relation Age of Onset   Heart disease Neg Hx    Cancer Neg Hx     Social History   Substance and Sexual Activity  Sexual Activity Yes  Birth control/protection: None     Objective:   Vitals:   12/27/22 0919  BP: 131/88  Pulse: 70  Temp: 98.5 F (36.9 C)  TempSrc: Oral  SpO2: 100%  Weight: 178 lb (80.7 kg)   Body mass index is 27.88 kg/m.  Physical Exam Vitals reviewed.  Constitutional:      Appearance: He is well-developed.     Comments: Seated comfortably in chair during visit.   HENT:     Mouth/Throat:     Dentition: Normal dentition. No dental abscesses.  Cardiovascular:     Rate and Rhythm: Normal rate and regular rhythm.  Pulmonary:     Effort: Pulmonary effort is normal. No respiratory distress.  Abdominal:     General: There is no distension.     Palpations: Abdomen is soft.     Tenderness: There is no abdominal tenderness.   Lymphadenopathy:     Cervical: No cervical adenopathy.  Skin:    General: Skin is warm and dry.     Findings: No rash.  Neurological:     Mental Status: He is alert and oriented to person, place, and time.  Psychiatric:        Judgment: Judgment normal.     Comments: In good spirits today and engaged in care discussion.      Lab Results Lab Results  Component Value Date   WBC 6.7 11/20/2022   HGB 14.6 11/20/2022   HCT 43.2 11/20/2022   MCV 90.2 11/20/2022   PLT 206 11/20/2022    Lab Results  Component Value Date   CREATININE 1.23 11/20/2022   BUN 16 11/20/2022   NA 141 11/20/2022   K 4.6 11/20/2022   CL 106 11/20/2022   CO2 28 11/20/2022    Lab Results  Component Value Date   ALT 45 11/20/2022   AST 25 11/20/2022   ALKPHOS 68 03/02/2021   BILITOT 1.0 11/20/2022    No results found for: "CHOL", "HDL", "LDLCALC", "LDLDIRECT", "TRIG", "CHOLHDL" HIV 1 RNA Quant (copies/mL)  Date Value  11/20/2022 4,440 (H)   CD4 T Cell Abs (/uL)  Date Value  11/20/2022 534     Assessment & Plan:   Problem List Items Addressed This Visit       Unprioritized   HIV (human immunodeficiency virus infection) - Primary    Doing well so far 40m in on Biktarvy treatment. No s/e and has received refill smoothly. We discussed all previous lab results in detail and answered questions. Hep  A immunity screen and Hep C Ab screen today to complete work up.  Continue biktarvy and recheck VL today for therapeutic response.  Discussed recommended vaccines - he would like to to do these but would like to work with [place] on timing so he can come in before a work break to deal with any unpleasant S/Es.   Recommended:  Prevnar 20 Menveo x 2 doses  HPV x 3 doses   Also discussed anal cancer screening - would like to proceed after current symptoms resolve so we can get a good sample.       Relevant Orders   HIV 1 RNA quant-no reflex-bld   Hepatitis C Antibody (Completed)   Hepatitis A Ab,  Total (Completed)   Cytology (oral, anal, urethral) ancillary only   Urine cytology ancillary only   Rectal discharge    Retreat with ceftriaxone 500 mg x 1, recommended partner get re-treated then wait 1 week after both are treated for further sex of any  kind between them both.  FU oral/rectal and urine testing for further recs if doxy needed.       Relevant Orders   Cytology (oral, anal, urethral) ancillary only   Cytology (oral, anal, urethral) ancillary only   Urine cytology ancillary only   Other Visit Diagnoses     Screening examination for venereal disease       Relevant Orders   Cytology (oral, anal, urethral) ancillary only   Urine cytology ancillary only      Rexene AlbertsStephanie Sagar Tengan, MSN, NP-C [facility] Pager: [phone] Office: [phone]  12/27/22  3:39 PM

## 2022-12-27 NOTE — Patient Instructions (Addendum)
Nice to see you  Keep taking your biktary everyday - stop by the lab so we can see how it's working for you.   Recommended Vaccines -  HPV vaccines (3 given over 6 months) Pneumonia vaccine (once and that's it) Meningitis (2 doses first, then every 5 years a single dose booster).   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   Would like to see you back in 3 months.   Will follow up with you on mychart with your test results.

## 2022-12-27 NOTE — Assessment & Plan Note (Signed)
Doing well so far 15m in on [place] treatment. No s/e and has received refill smoothly. We discussed all previous lab results in detail and answered questions. Hep  A immunity screen and Hep C Ab screen today to complete work up.  Continue biktarvy and recheck VL today for therapeutic response.  Discussed recommended vaccines - he would like to to do these but would like to work with [place] on timing so he can come in before a work break to deal with any unpleasant S/Es.   Recommended:  Prevnar 20 Menveo x 2 doses  HPV x 3 doses   Also discussed anal cancer screening - would like to proceed after current symptoms resolve so we can get a good sample.

## 2022-12-30 LAB — HEPATITIS C ANTIBODY: Hepatitis C Ab: NONREACTIVE

## 2022-12-30 LAB — HIV-1 RNA QUANT-NO REFLEX-BLD
HIV 1 RNA Quant: 56 Copies/mL — ABNORMAL HIGH
HIV-1 RNA Quant, Log: 1.75 Log cps/mL — ABNORMAL HIGH

## 2022-12-30 LAB — HEPATITIS A ANTIBODY, TOTAL: Hepatitis A AB,Total: REACTIVE — AB

## 2023-01-01 LAB — CYTOLOGY, (ORAL, ANAL, URETHRAL) ANCILLARY ONLY
Chlamydia: NEGATIVE
Chlamydia: NEGATIVE
Comment: NEGATIVE
Comment: NEGATIVE
Comment: NORMAL
Comment: NORMAL
Neisseria Gonorrhea: NEGATIVE
Neisseria Gonorrhea: NEGATIVE

## 2023-01-01 LAB — URINE CYTOLOGY ANCILLARY ONLY
Chlamydia: NEGATIVE
Comment: NEGATIVE
Comment: NORMAL
Neisseria Gonorrhea: NEGATIVE

## 2023-04-29 ENCOUNTER — Other Ambulatory Visit: Payer: Self-pay | Admitting: Pharmacist

## 2023-04-29 DIAGNOSIS — Z21 Asymptomatic human immunodeficiency virus [HIV] infection status: Secondary | ICD-10-CM

## 2023-06-07 ENCOUNTER — Other Ambulatory Visit: Payer: Self-pay | Admitting: Infectious Diseases

## 2023-06-07 DIAGNOSIS — Z21 Asymptomatic human immunodeficiency virus [HIV] infection status: Secondary | ICD-10-CM

## 2023-07-03 ENCOUNTER — Other Ambulatory Visit: Payer: Self-pay | Admitting: Infectious Diseases

## 2023-07-03 DIAGNOSIS — Z21 Asymptomatic human immunodeficiency virus [HIV] infection status: Secondary | ICD-10-CM

## 2023-07-03 NOTE — Telephone Encounter (Signed)
Patient needs appointment.

## 2023-07-08 ENCOUNTER — Telehealth: Payer: Self-pay

## 2023-07-08 NOTE — Telephone Encounter (Signed)
Kanen is due for an appointment, called to schedule, no answer. Left HIPAA compliant voicemail requesting callback.   Sandie Ano, RN

## 2023-07-09 ENCOUNTER — Other Ambulatory Visit (HOSPITAL_COMMUNITY): Payer: Self-pay

## 2023-08-05 ENCOUNTER — Ambulatory Visit: Payer: BC Managed Care – PPO | Admitting: Infectious Diseases

## 2023-08-05 ENCOUNTER — Other Ambulatory Visit: Payer: Self-pay | Admitting: Infectious Diseases

## 2023-08-05 DIAGNOSIS — Z21 Asymptomatic human immunodeficiency virus [HIV] infection status: Secondary | ICD-10-CM

## 2023-08-06 NOTE — Telephone Encounter (Signed)
Lvm to call to schedule appt

## 2023-08-12 ENCOUNTER — Other Ambulatory Visit: Payer: Self-pay | Admitting: Infectious Diseases

## 2023-08-12 DIAGNOSIS — Z21 Asymptomatic human immunodeficiency virus [HIV] infection status: Secondary | ICD-10-CM

## 2023-08-27 DIAGNOSIS — R070 Pain in throat: Secondary | ICD-10-CM | POA: Diagnosis not present

## 2023-08-27 DIAGNOSIS — J020 Streptococcal pharyngitis: Secondary | ICD-10-CM | POA: Diagnosis not present

## 2023-08-30 ENCOUNTER — Other Ambulatory Visit: Payer: Self-pay | Admitting: Infectious Diseases

## 2023-08-30 DIAGNOSIS — Z21 Asymptomatic human immunodeficiency virus [HIV] infection status: Secondary | ICD-10-CM

## 2023-09-24 ENCOUNTER — Telehealth: Payer: Self-pay

## 2023-09-24 ENCOUNTER — Other Ambulatory Visit: Payer: Self-pay | Admitting: Infectious Diseases

## 2023-09-24 DIAGNOSIS — Z21 Asymptomatic human immunodeficiency virus [HIV] infection status: Secondary | ICD-10-CM

## 2023-09-24 NOTE — Telephone Encounter (Signed)
 Patient overdue for appointment, no answer. Left HIPAA compliant voicemail requesting callback.   Sandie Ano, RN

## 2023-09-24 NOTE — Telephone Encounter (Signed)
 Overdue for appointment, called to schedule, no answer. Left HIPAA compliant voicemail requesting callback.   Sandie Ano, RN

## 2023-10-03 ENCOUNTER — Ambulatory Visit: Payer: BC Managed Care – PPO | Admitting: Infectious Diseases

## 2023-10-03 ENCOUNTER — Other Ambulatory Visit (HOSPITAL_COMMUNITY)
Admission: RE | Admit: 2023-10-03 | Discharge: 2023-10-03 | Disposition: A | Discharge status: Home / Self Care | Payer: BC Managed Care – PPO | Source: Ambulatory Visit | Attending: Infectious Diseases | Admitting: Infectious Diseases

## 2023-10-03 ENCOUNTER — Encounter: Payer: Self-pay | Admitting: Infectious Diseases

## 2023-10-03 ENCOUNTER — Other Ambulatory Visit: Payer: Self-pay

## 2023-10-03 VITALS — BP 126/86 | HR 91 | Temp 98.0°F | Ht 67.0 in | Wt 180.0 lb

## 2023-10-03 DIAGNOSIS — Z113 Encounter for screening for infections with a predominantly sexual mode of transmission: Secondary | ICD-10-CM

## 2023-10-03 DIAGNOSIS — C20 Malignant neoplasm of rectum: Secondary | ICD-10-CM

## 2023-10-03 DIAGNOSIS — Z21 Asymptomatic human immunodeficiency virus [HIV] infection status: Secondary | ICD-10-CM

## 2023-10-03 DIAGNOSIS — B20 Human immunodeficiency virus [HIV] disease: Secondary | ICD-10-CM | POA: Diagnosis not present

## 2023-10-03 DIAGNOSIS — I10 Essential (primary) hypertension: Secondary | ICD-10-CM | POA: Diagnosis not present

## 2023-10-03 MED ORDER — BIKTARVY 50-200-25 MG PO TABS: 1.0000 | ORAL_TABLET | Freq: Every day | ORAL | 11 refills | Status: DC

## 2023-10-03 MED ORDER — AMLODIPINE BESYLATE 10 MG PO TABS: 10.0000 mg | ORAL_TABLET | Freq: Every day | ORAL | 5 refills | Status: DC

## 2023-10-03 NOTE — Patient Instructions (Addendum)
  140/90 is your blood pressure goal.  Would like to see how your blood pressure looks on the Amlodipine alone - start this pill back once a day with your biktarvy.   You may beneift from a cholesterol medication called CRESTOR - will update you when we have all your labs back to decide what's best.    Will see you back in 4 months to check in on blood pressure on amlodipine alone

## 2023-10-03 NOTE — Progress Notes (Signed)
Name: Stanley Schwartz  DOB: 08-Jul-1981 MRN: [id number] PCP: Pcp, No    Brief Narrative:  Stanley Schwartz is a 43 y.o. male with HIV, Dx 11/01/2022 during routine STI visit at [facility]. Previously on PrEP (Truvada >> Descovy) with a lapse by about a few months in 2023 due to insurance changes.  Last negative test was in December 2023 CD4 nadir pending VL pending HIV Risk: Sexual/MSM History of OIs: None Intake Labs 11-2022: Hep B sAg (p), sAb (p), cAb (p); Hep A (p), Hep C (p) Quantiferon (p) HLA B*5701 (p) G6PD: (p)   Previous Regimens: Biktarvy - hx of descovy for PrEP / M184V  Genotypes: Pending 11/2022  Subjective   Subjective:   Chief Complaint  Patient presents with   Follow-up    Discussed the use of AI scribe software for clinical note transcription with the patient, who gave verbal consent to proceed.  History of Present Illness   Stanley Schwartz, with a history of hypertension and HIV, presents for a routine follow-up. He stopped his antihypertensive medications due to side effects, including frequent urination and just in general wondering if he needed them anymore. He has the ability to check blood pressures at home He expresses willingness to restart antihypertensive therapy, specifically requesting a medication that does not induce urination.  The patient also reports inconsistent timing in taking his HIV medication, Biktarvy, due to fluctuations in his work schedule. He expresses concern about the potential impact of this inconsistency on his health, but denies any current symptoms related to his HIV status.  The patient denies any other health concerns at this time. He has not been experiencing any symptoms suggestive of sexually transmitted infections, but agrees to routine sexual health screenings. He also consents to an anal Pap smear for HPV screening, acknowledging a history of rectal sex.  The patient declines the offer of a flu shot, citing past experiences of feeling unwell  after vaccinations. He expresses a preference for avoiding vaccines unless deemed absolutely necessary for his health.       Review of Systems  Constitutional:  Negative for chills and fever.  HENT:  Negative for tinnitus.   Eyes:  Negative for blurred vision and photophobia.  Respiratory:  Negative for cough and sputum production.   Cardiovascular:  Negative for chest pain.  Gastrointestinal:  Negative for diarrhea, nausea and vomiting.  Genitourinary:  Negative for dysuria.  Skin:  Negative for rash.  Neurological:  Negative for headaches.     Past Medical History:  Diagnosis Date   History of syphilis 11/21/2022   HIV (human immunodeficiency virus infection) (HCC)    Hypertension     Outpatient Medications Prior to Visit  Medication Sig Dispense Refill   BIKTARVY 50-200-25 MG TABS tablet TAKE 1 TABLET BY MOUTH 1 TIME A DAY 30 tablet 0   amLODipine (NORVASC) 10 MG tablet Take 1 tablet (10 mg total) by mouth daily. (Patient not taking: Reported on 10/03/2023) 30 tablet 0   hydrochlorothiazide (HYDRODIURIL) 12.5 MG tablet Take 1 tablet (12.5 mg total) by mouth daily. (Patient not taking: Reported on 10/03/2023) 30 tablet 0   No facility-administered medications prior to visit.     Allergies  Allergen Reactions   Amoxicillin Itching    Treated with bicillin in 2022/2023 and tolerated well per patient report    Social History   Tobacco Use   Smoking status: Every Day    Current packs/day: 0.60    Average packs/day: 0.6 packs/day for 4.0 years (2.4  ttl pk-yrs)    Types: Cigarettes    Start date: 09/18/2019   Smokeless tobacco: Never  Vaping Use   Vaping status: Never Used  Substance Use Topics   Alcohol use: Not Currently    Comment: socially   Drug use: Never    Family History  Problem Relation Age of Onset   Heart disease Neg Hx    Cancer Neg Hx     Social History   Substance and Sexual Activity  Sexual Activity Yes   Birth control/protection: None    Comment: declined condoms 09/2023     Objective    Objective:   Vitals:   10/03/23 0926 10/03/23 0957  BP: (!) 152/87 126/86  Pulse: 91   Temp: 98 F (36.7 C)   TempSrc: Oral   SpO2: 100%   Weight: 180 lb (81.6 kg)   Height: 5\' 7"  (1.702 m)     Body mass index is 28.19 kg/m.  Physical Exam Vitals reviewed.  Constitutional:      Appearance: He is well-developed.     Comments: Seated comfortably in chair during visit.   HENT:     Mouth/Throat:     Dentition: Normal dentition. No dental abscesses.  Cardiovascular:     Rate and Rhythm: Normal rate and regular rhythm.  Pulmonary:     Effort: Pulmonary effort is normal. No respiratory distress.  Abdominal:     General: There is no distension.     Palpations: Abdomen is soft.     Tenderness: There is no abdominal tenderness.  Lymphadenopathy:     Cervical: No cervical adenopathy.  Skin:    General: Skin is warm and dry.     Findings: No rash.  Neurological:     Mental Status: He is alert and oriented to person, place, and time.  Psychiatric:        Judgment: Judgment normal.     Comments: In good spirits today and engaged in care discussion.      Lab Results Lab Results  Component Value Date   WBC 6.7 11/20/2022   HGB 14.6 11/20/2022   HCT 43.2 11/20/2022   MCV 90.2 11/20/2022   PLT 206 11/20/2022    Lab Results  Component Value Date   CREATININE 1.23 11/20/2022   BUN 16 11/20/2022   NA 141 11/20/2022   K 4.6 11/20/2022   CL 106 11/20/2022   CO2 28 11/20/2022    Lab Results  Component Value Date   ALT 45 11/20/2022   AST 25 11/20/2022   ALKPHOS 68 03/02/2021   BILITOT 1.0 11/20/2022    No results found for: "CHOL", "HDL", "LDLCALC", "LDLDIRECT", "TRIG", "CHOLHDL" HIV 1 RNA Quant  Date Value  12/27/2022 56 Copies/mL (H)  11/20/2022 4,440 copies/mL (H)   CD4 T Cell Abs (/uL)  Date Value  11/20/2022 534        Assessment & Plan:     Hypertension - Patient stopped antihypertensive  medication due to frequent urination. Blood pressure slightly elevated in office. Patient has home blood pressure cuff and is able to monitor blood pressure at home. -Resume amlodipine (Norvir) and monitor blood pressure at home. -Recheck blood pressure in office at next visit. -Fasting lipid today for statin screening  HIV Management - Patient reports inconsistent timing of antiretroviral medication (Biktarvy) due to fluctuating work schedule. No reported missed doses. -Encouraged consistent daily dosing, considering change to morning administration. -Continue Biktarvy as prescribed.  Cardiovascular Risk Discussion of new data regarding potential benefit of  statin therapy in HIV patients over 40 to reduce cardiovascular risk. -Order cholesterol panel to assess cardiovascular risk and need for statin therapy.  Anal Cancer Screening Discussed new guidelines for anal cancer screening in patients with history of rectal sex due to increased risk from HPV. -Perform anal Pap smear today for HPV and cancer screening.  Sexual Health No reported symptoms. -Perform routine sexual health screenings today.      Meds ordered this encounter  Medications   DISCONTD: amLODipine (NORVASC) 10 MG tablet    Sig: Take 1 tablet (10 mg total) by mouth daily.    Dispense:  30 tablet    Refill:  5   bictegravir-emtricitabine-tenofovir AF (BIKTARVY) 50-200-25 MG TABS tablet    Sig: Take 1 tablet by mouth daily.    Dispense:  30 tablet    Refill:  11    Additional refills to be provided at 1/16 appt    Prescription Type::   Renewal   amLODipine (NORVASC) 10 MG tablet    Sig: Take 1 tablet (10 mg total) by mouth daily.    Dispense:  30 tablet    Refill:  5   Orders Placed This Encounter  Procedures   Lipid panel   HIV 1 RNA quant-no reflex-bld   RPR   COMPLETE METABOLIC PANEL WITH GFR   CBC   T-helper cells (CD4) count    Return in about 4 months (around 01/31/2024).   Rexene Alberts, MSN,  NP-C [facility] Pager: [phone] Office: [phone]  10/03/23  10:29 AM

## 2023-10-04 LAB — CYTOLOGY, (ORAL, ANAL, URETHRAL) ANCILLARY ONLY
Chlamydia: NEGATIVE
Chlamydia: POSITIVE — AB
Comment: NEGATIVE
Comment: NEGATIVE
Comment: NORMAL
Comment: NORMAL
Neisseria Gonorrhea: NEGATIVE
Neisseria Gonorrhea: POSITIVE — AB

## 2023-10-04 LAB — URINE CYTOLOGY ANCILLARY ONLY
Chlamydia: NEGATIVE
Comment: NEGATIVE
Comment: NORMAL
Neisseria Gonorrhea: NEGATIVE

## 2023-10-06 LAB — COMPLETE METABOLIC PANEL WITH GFR
AG Ratio: 2 (calc) (ref 1.0–2.5)
ALT: 25 U/L (ref 9–46)
AST: 22 U/L (ref 10–40)
Albumin: 4.6 g/dL (ref 3.6–5.1)
Alkaline phosphatase (APISO): 57 U/L (ref 36–130)
BUN/Creatinine Ratio: 11 (calc) (ref 6–22)
BUN: 16 mg/dL (ref 7–25)
CO2: 27 mmol/L (ref 20–32)
Calcium: 9.5 mg/dL (ref 8.6–10.3)
Chloride: 107 mmol/L (ref 98–110)
Creat: 1.5 mg/dL — ABNORMAL HIGH (ref 0.60–1.29)
Globulin: 2.3 g/dL (ref 1.9–3.7)
Glucose, Bld: 95 mg/dL (ref 65–99)
Potassium: 4.5 mmol/L (ref 3.5–5.3)
Sodium: 143 mmol/L (ref 135–146)
Total Bilirubin: 1.1 mg/dL (ref 0.2–1.2)
Total Protein: 6.9 g/dL (ref 6.1–8.1)
eGFR: 60 mL/min/{1.73_m2} (ref >=60)

## 2023-10-06 LAB — CBC
HCT: 45 % (ref 38.5–50.0)
Hemoglobin: 15.1 g/dL (ref 13.2–17.1)
MCH: 31.4 pg (ref 27.0–33.0)
MCHC: 33.6 g/dL (ref 32.0–36.0)
MCV: 93.6 fL (ref 80.0–100.0)
MPV: 10.8 fL (ref 7.5–12.5)
Platelets: 192 10*3/uL (ref 140–400)
RBC: 4.81 10*6/uL (ref 4.20–5.80)
RDW: 11.5 % (ref 11.0–15.0)
WBC: 8.7 10*3/uL (ref 3.8–10.8)

## 2023-10-06 LAB — T-HELPER CELLS (CD4) COUNT (NOT AT ARMC)
Absolute CD4: 741 {cells}/uL (ref 490–1740)
CD4 T Helper %: 38 % (ref 30–61)
Total lymphocyte count: 1926 {cells}/uL (ref 850–3900)

## 2023-10-06 LAB — LIPID PANEL
Cholesterol: 146 mg/dL (ref <200)
HDL: 52 mg/dL (ref >=40)
LDL Cholesterol (Calc): 79 mg/dL
Non-HDL Cholesterol (Calc): 94 mg/dL (ref <130)
Total CHOL/HDL Ratio: 2.8 (calc) (ref <5.0)
Triglycerides: 66 mg/dL (ref <150)

## 2023-10-06 LAB — RPR: RPR Ser Ql: NONREACTIVE

## 2023-10-06 LAB — HIV-1 RNA QUANT-NO REFLEX-BLD
HIV 1 RNA Quant: <20 {copies}/mL — ABNORMAL HIGH
HIV-1 RNA Quant, Log: <1.3 {Log} — ABNORMAL HIGH

## 2023-10-07 LAB — CYTOLOGY - PAP
Comment: NEGATIVE
Diagnosis: NEGATIVE
Diagnosis: REACTIVE
High risk HPV: NEGATIVE

## 2023-10-08 ENCOUNTER — Ambulatory Visit (INDEPENDENT_AMBULATORY_CARE_PROVIDER_SITE_OTHER): Payer: BC Managed Care – PPO

## 2023-10-08 ENCOUNTER — Other Ambulatory Visit: Payer: Self-pay

## 2023-10-08 ENCOUNTER — Telehealth: Payer: Self-pay

## 2023-10-08 ENCOUNTER — Other Ambulatory Visit: Payer: Self-pay | Admitting: Infectious Diseases

## 2023-10-08 DIAGNOSIS — A749 Chlamydial infection, unspecified: Secondary | ICD-10-CM

## 2023-10-08 DIAGNOSIS — A549 Gonococcal infection, unspecified: Secondary | ICD-10-CM

## 2023-10-08 MED ORDER — CEFTRIAXONE SODIUM 500 MG IJ SOLR
500.0000 mg | Freq: Once | INTRAMUSCULAR | Status: AC
  Administered 2023-10-08: 500 mg via INTRAMUSCULAR

## 2023-10-08 MED ORDER — ROSUVASTATIN CALCIUM 10 MG PO TABS: 10.0000 mg | ORAL_TABLET | Freq: Every day | ORAL | 3 refills | Status: DC

## 2023-10-08 MED ORDER — DOXYCYCLINE HYCLATE 100 MG PO TABS: 100.0000 mg | ORAL_TABLET | Freq: Two times a day (BID) | ORAL | 0 refills | Status: AC

## 2023-10-08 NOTE — Telephone Encounter (Signed)
5 refills were sent 10/03/23

## 2023-10-08 NOTE — Telephone Encounter (Signed)
Patient called office today regarding results on mychart. Per Dixon Np pt will need 500 mg IM Ceftriaxone for the gonorrhea and 100 mg doxy bid for 7d for Chlamydia.  Pt will come in today for Rocephin. Will send doxy to [facility].  No questions at this time time regarding results or treatment. Juanita Laster, RMA

## 2023-10-08 NOTE — Addendum Note (Signed)
Addended by: Blanchard Kelch on: 10/08/2023 03:50 PM   Modules accepted: Orders

## 2023-10-14 ENCOUNTER — Other Ambulatory Visit: Payer: Self-pay

## 2023-10-14 MED ORDER — AMLODIPINE BESYLATE 10 MG PO TABS: 10.0000 mg | ORAL_TABLET | Freq: Every day | ORAL | 5 refills | Status: DC

## 2023-10-15 ENCOUNTER — Other Ambulatory Visit: Payer: Self-pay | Admitting: Infectious Diseases

## 2023-11-06 DIAGNOSIS — M545 Low back pain, unspecified: Secondary | ICD-10-CM | POA: Diagnosis not present

## 2023-11-06 DIAGNOSIS — K5900 Constipation, unspecified: Secondary | ICD-10-CM | POA: Diagnosis not present

## 2023-11-06 DIAGNOSIS — R1084 Generalized abdominal pain: Secondary | ICD-10-CM | POA: Diagnosis not present

## 2024-02-14 NOTE — Progress Notes (Signed)
 The 10-year ASCVD risk score (Arnett DK, et al., 2019) is: 7.7%   Values used to calculate the score:     Age: 43 years     Sex: Male     Is Non-Hispanic African American: Yes     Diabetic: No     Tobacco smoker: Yes     Systolic Blood Pressure: 126 mmHg     Is BP treated: Yes     HDL Cholesterol: 52 mg/dL     Total Cholesterol: 146 mg/dL  Currently prescribed rosuvastatin  10 mg.  Cheyanna Strick, BSN, RN

## 2024-07-31 ENCOUNTER — Other Ambulatory Visit: Payer: Self-pay

## 2024-07-31 ENCOUNTER — Ambulatory Visit: Admitting: Infectious Diseases

## 2024-07-31 ENCOUNTER — Encounter: Payer: Self-pay | Admitting: Infectious Diseases

## 2024-07-31 ENCOUNTER — Other Ambulatory Visit (HOSPITAL_COMMUNITY)
Admission: RE | Admit: 2024-07-31 | Discharge: 2024-07-31 | Disposition: A | Source: Ambulatory Visit | Attending: Infectious Diseases | Admitting: Infectious Diseases

## 2024-07-31 VITALS — BP 136/88 | HR 92 | Temp 98.1°F | Ht 67.0 in | Wt 185.0 lb

## 2024-07-31 DIAGNOSIS — Z21 Asymptomatic human immunodeficiency virus [HIV] infection status: Secondary | ICD-10-CM

## 2024-07-31 DIAGNOSIS — Z202 Contact with and (suspected) exposure to infections with a predominantly sexual mode of transmission: Secondary | ICD-10-CM | POA: Insufficient documentation

## 2024-07-31 DIAGNOSIS — Z7901 Long term (current) use of anticoagulants: Secondary | ICD-10-CM

## 2024-07-31 DIAGNOSIS — R5383 Other fatigue: Secondary | ICD-10-CM

## 2024-07-31 DIAGNOSIS — Z79899 Other long term (current) drug therapy: Secondary | ICD-10-CM | POA: Diagnosis not present

## 2024-07-31 DIAGNOSIS — R195 Other fecal abnormalities: Secondary | ICD-10-CM | POA: Diagnosis not present

## 2024-07-31 DIAGNOSIS — Z8619 Personal history of other infectious and parasitic diseases: Secondary | ICD-10-CM

## 2024-07-31 DIAGNOSIS — I10 Essential (primary) hypertension: Secondary | ICD-10-CM

## 2024-07-31 MED ORDER — CEFTRIAXONE SODIUM 500 MG IJ SOLR
500.0000 mg | Freq: Once | INTRAMUSCULAR | Status: AC
  Administered 2024-07-31: 500 mg via INTRAMUSCULAR

## 2024-07-31 MED ORDER — ROSUVASTATIN CALCIUM 10 MG PO TABS: 10.0000 mg | ORAL_TABLET | Freq: Every day | ORAL | 3 refills | Status: AC

## 2024-07-31 MED ORDER — AMLODIPINE BESYLATE 10 MG PO TABS: 10.0000 mg | ORAL_TABLET | Freq: Every day | ORAL | 3 refills | Status: AC

## 2024-07-31 NOTE — Progress Notes (Signed)
 Name: Stanley Schwartz  DOB: 24-Nov-1980 MRN: [id number] PCP: Pcp, No    Brief Narrative:  Stanley Schwartz is a 43 y.o. male with HIV, Dx 11/01/2022 during routine STI visit at [facility]. Previously on PrEP (Truvada >> Descovy) with a lapse by about a few months in 2023 due to insurance changes.  Last negative test was in December 2023 CD4 nadir pending VL pending HIV Risk: Sexual/MSM History of OIs: None Intake Labs 11-2022: Hep B sAg (-), sAb (+), cAb (-); Hep A (p), Hep C (p) Quantiferon (p) HLA B*5701 (p) G6PD: (p)   Previous Regimens: Biktarvy  - hx of descovy for PrEP / M184V  Genotypes: Pending 11/2022  Subjective   Subjective:   Chief Complaint  Patient presents with   Follow-up    Partner tested + for gonorrhea 11/10 Unable to get amlodipine     Discussed the use of AI scribe software for clinical note transcription with the patient, who gave verbal consent to proceed.  History of Present Illness   [name] is a 43 year old male with HIV who presents for a routine follow-up and concern for gonorrhea exposure.  He is here for a routine follow-up. His last office visit was in February of this year, at which time he was doing well on once daily Biktarvy . His viral load was undetectable ten months ago. He has not been taking his amlodipine  for some time due to pharmacy issues, and his blood pressure is elevated today. He is concerned about potential exposure to gonorrhea after his sexual partner was diagnosed with it a few days ago. He currently has no symptoms but wants to be tested and treated.  He describes a history of rectal itching and mucous discharge since January, which he initially attributed to parasites after researching online. He reports seeing 'squiggly things' and 'holes' in his stool. He has tried home remedies such as papaya seeds and activated charcoal, which he feels have provided some relief. He also mentions a past episode of stomach pain six months ago, for which  he visited urgent care and was advised to go to the emergency room due to a suspected blockage seen on an x-ray, but he did not follow up due to work and financial constraints. He has not had any travel history, no undercooked/raw foods.   He reports persistent fatigue, stating that he can only manage to go to work and then sleep. He describes his sleep pattern as going to bed around 9 PM, waking up between 4:30 and 6 AM, and sometimes taking naps during the day. He does not use an alarm clock and prefers to wake up naturally. He is trying to change his diet and reduce smoking.  He is currently taking Biktarvy  once daily. He has not been taking amlodipine  due to pharmacy issues.       Review of Systems  Constitutional:  Positive for malaise/fatigue. Negative for chills, diaphoresis, fever and weight loss.  HENT:  Negative for tinnitus.   Eyes:  Negative for blurred vision and photophobia.  Respiratory:  Negative for cough and sputum production.   Cardiovascular:  Negative for chest pain.  Gastrointestinal:  Negative for diarrhea, nausea and vomiting.  Genitourinary:  Negative for dysuria.  Skin:  Positive for itching (anal itching). Negative for rash.  Neurological:  Negative for headaches.     Past Medical History:  Diagnosis Date   History of syphilis 11/21/2022   HIV (human immunodeficiency virus infection) (HCC)    Hypertension  Outpatient Medications Prior to Visit  Medication Sig Dispense Refill   bictegravir-emtricitabine-tenofovir AF (BIKTARVY ) 50-200-25 MG TABS tablet Take 1 tablet by mouth daily. 30 tablet 11   amLODipine  (NORVASC ) 10 MG tablet TAKE 1 TABLET DAILY (Patient not taking: Reported on 07/31/2024) 90 tablet 1   rosuvastatin  (CRESTOR ) 10 MG tablet Take 1 tablet (10 mg total) by mouth daily. (Patient not taking: Reported on 07/31/2024) 30 tablet 3   No facility-administered medications prior to visit.     Allergies  Allergen Reactions   Amoxicillin Itching     Treated with bicillin in 2022/2023 and tolerated well per patient report    Social History   Tobacco Use   Smoking status: Every Day    Current packs/day: 0.60    Average packs/day: 0.6 packs/day for 4.9 years (2.9 ttl pk-yrs)    Types: Cigarettes    Start date: 09/18/2019   Smokeless tobacco: Never  Vaping Use   Vaping status: Never Used  Substance Use Topics   Alcohol use: Not Currently    Comment: socially   Drug use: Never    Family History  Problem Relation Age of Onset   Heart disease Neg Hx    Cancer Neg Hx     Social History   Substance and Sexual Activity  Sexual Activity Yes   Birth control/protection: None     Objective    Objective:   Vitals:   07/31/24 0859 07/31/24 0922  BP: (!) 166/101 136/88  Pulse: 92   Temp: 98.1 F (36.7 C)   TempSrc: Temporal   SpO2: 96%   Weight: 185 lb (83.9 kg)   Height: 5' 7 (1.702 m)     Body mass index is 28.98 kg/m.  Physical Exam Vitals reviewed.  Constitutional:      Appearance: He is well-developed.     Comments: Seated comfortably in chair during visit.   HENT:     Mouth/Throat:     Dentition: Normal dentition. No dental abscesses.  Cardiovascular:     Rate and Rhythm: Normal rate and regular rhythm.  Pulmonary:     Effort: Pulmonary effort is normal. No respiratory distress.  Abdominal:     General: There is no distension.     Palpations: Abdomen is soft.     Tenderness: There is no abdominal tenderness.  Lymphadenopathy:     Cervical: No cervical adenopathy.  Skin:    General: Skin is warm and dry.     Findings: No rash.  Neurological:     Mental Status: He is alert and oriented to person, place, and time.  Psychiatric:        Judgment: Judgment normal.     Comments: In good spirits today and engaged in care discussion.      Lab Results Lab Results  Component Value Date   WBC 8.7 10/03/2023   HGB 15.1 10/03/2023   HCT 45.0 10/03/2023   MCV 93.6 10/03/2023   PLT 192 10/03/2023     Lab Results  Component Value Date   CREATININE 1.50 (H) 10/03/2023   BUN 16 10/03/2023   NA 143 10/03/2023   K 4.5 10/03/2023   CL 107 10/03/2023   CO2 27 10/03/2023    Lab Results  Component Value Date   ALT 25 10/03/2023   AST 22 10/03/2023   ALKPHOS 68 03/02/2021   BILITOT 1.1 10/03/2023    Lab Results  Component Value Date   CHOL 146 10/03/2023   HDL 52 10/03/2023   LDLCALC  79 10/03/2023   TRIG 66 10/03/2023   CHOLHDL 2.8 10/03/2023   HIV 1 RNA Quant  Date Value  10/03/2023 <20 Copies/mL (H)  12/27/2022 56 Copies/mL (H)  11/20/2022 4,440 copies/mL (H)   CD4 T Cell Abs (/uL)  Date Value  11/20/2022 534        Assessment & Plan:     Concern for intestinal parasitic infection anal pruritis and fatigue - Reports rectal itching, mucous discharge, and fatigue. He feels symptoms may be due to a parasitic infection. He denies any weight loss, fevers/chills, rashes on skin. No travel/animal or raw food risk identified in discussion. Ruled out STIs. Previous urgent care visit for abdominal pain with inconclusive x-ray findings. Self-administered papaya seeds and activated charcoal with some symptom improvement. He does self-administer rectal douche for sex with tap water and suspect some degree of mechanical/chemical irritation from this could play a role. Differential includes fungal / eczematous dermatitis vs, s/e from douche with non-isotonic solution. With fatigue will screen for nutritional deficiencies. Pinworms would be main parasite differential given his description of symptoms with lack of systemic signs; but overal my concern is low for this. Does not sound like Giardia or bacterial GI process with lack of systemic symptoms and no diarrhea.  - Ordered stool ova and parasite collection kit - 2 or 3 samples increase sensitivity  - Ordered blood tests including nutritional panels (folate, B12, iron) and differential for fatigue.  - CBC with diff check for elevated  eosinophils (has not had in the past historicaly) - Ordered strongyloidiasis IgG (low likelihood)  - Advised on saline water for rectal hygiene to prevent tissue damage. - No indication for parasite treatment at this time  Contact with and suspected exposure to gonorrhea - Recent exposure to gonorrhea through a sexual partner diagnosed with the infection. - Administered treatment for gonorrhea.  HIV infection, stable on antiretroviral therapy - HIV infection well-managed on once daily Biktarvy . Last viral load undetectable ten months ago. - Continue current antiretroviral therapy with Biktarvy .  Hypertension - Elevated blood pressure today. Has not taken amlodipine  due to pharmacy issues. - Sent prescription for amlodipine  to mail order pharmacy with increased number of refills.  Medication / Drug Interactions -  Avoid activated charcoal and biktarvy  in the stomach together. Recommend spacing out 6 hours apart for most conservative recommendation. Time includes personal discussion with pharmacy team.      Meds ordered this encounter  Medications   cefTRIAXone  (ROCEPHIN ) injection 500 mg   rosuvastatin  (CRESTOR ) 10 MG tablet    Sig: Take 1 tablet (10 mg total) by mouth daily.    Dispense:  90 tablet    Refill:  3   amLODipine  (NORVASC ) 10 MG tablet    Sig: Take 1 tablet (10 mg total) by mouth daily.    Dispense:  90 tablet    Refill:  3   Orders Placed This Encounter  Procedures   Ova and parasite examination    Standing Status:   Future    Expiration Date:   07/31/2025   GIARDIA ANTIGEN, EIA, STOOL    Standing Status:   Future    Expiration Date:   07/31/2025   RPR   Strongyloides antibody   Vitamin D (25 hydroxy)   B12 and Folate Panel   Iron, TIBC and Ferritin Panel   CBC w/Diff   HIV 1 RNA quant-no reflex-bld   TEST AUTHORIZATION   Giardia and Cryptosporidium Antigen Panel    Standing Status:  Future    Expiration Date:   07/31/2025    Return in about 3  months (around 10/31/2024).   Corean Fireman, MSN, NP-C [facility] Pager: [phone] Office: [phone]  07/31/24  3:27 PM   I personally spent a total of 42 minutes in the care of the patient today including face to face duration of 24 minutes preparing to see the patient, getting/reviewing separately obtained history, performing a medically appropriate exam/evaluation, placing orders, referring and communicating with other health care professionals, and documenting clinical information in the EHR.

## 2024-07-31 NOTE — Patient Instructions (Addendum)
 Fleet Enema Extra product is better than tap water   OUR PLAN:  -Stool changes -  You have been experiencing rectal itching, mucous discharge, and fatigue, which may be due to a parasitic infection. We have ordered stool tests and blood tests to check for parasites and nutritional deficiencies. In the meantime, use saline water for rectal hygiene to prevent tissue damage. We will consider antiparasitic treatment if no other cause is identified.  -CONTACT WITH AND SUSPECTED EXPOSURE TO GONORRHEA: You were recently exposed to gonorrhea through a sexual partner. Gonorrhea is a sexually transmitted infection that can cause various symptoms. We have administered treatment for gonorrhea today.  -HIV INFECTION, STABLE ON ANTIRETROVIRAL THERAPY: Your HIV infection is well-managed with your current medication, Biktarvy . Continue taking this medication as prescribed.  -HYPERTENSION: Your blood pressure is elevated, likely because you have not been taking your amlodipine  due to pharmacy issues. Hypertension is high blood pressure, which can lead to serious health problems if not managed. We have sent a prescription for amlodipine  to a mail order pharmacy with increased refills to ensure you have enough medication.  INSTRUCTIONS:  Please follow up with the stool and blood tests as ordered. Continue taking your Biktarvy  as prescribed. Start taking your amlodipine  once it arrives from the mail order pharmacy. If you experience any new symptoms or have concerns, please contact our office.  Smoking Cessation: QuitlineNC 1-800-QUIT-NOW 678-322-1536); Espaol: 1-855-Djelo-Ya (1-334 180 0548) [email or web address]

## 2024-08-03 ENCOUNTER — Ambulatory Visit: Payer: Self-pay | Admitting: Infectious Diseases

## 2024-08-03 LAB — URINE CYTOLOGY ANCILLARY ONLY
Chlamydia: NEGATIVE
Comment: NEGATIVE
Comment: NORMAL
Neisseria Gonorrhea: NEGATIVE

## 2024-08-03 LAB — CYTOLOGY, (ORAL, ANAL, URETHRAL) ANCILLARY ONLY
Chlamydia: NEGATIVE
Chlamydia: NEGATIVE
Comment: NEGATIVE
Comment: NEGATIVE
Comment: NORMAL
Comment: NORMAL
Neisseria Gonorrhea: POSITIVE — AB
Neisseria Gonorrhea: POSITIVE — AB

## 2024-08-05 LAB — TEST AUTHORIZATION: TEST NAME:: 17306

## 2024-08-05 LAB — B12 AND FOLATE PANEL
Folate: 8.7 ng/mL
Vitamin B-12: 239 pg/mL (ref 200–1100)

## 2024-08-05 LAB — CBC WITH DIFFERENTIAL/PLATELET
Absolute Lymphocytes: 2266 {cells}/uL (ref 850–3900)
Absolute Monocytes: 428 {cells}/uL (ref 200–950)
Basophils Absolute: 18 {cells}/uL (ref 0–200)
Basophils Relative: 0.2 %
Eosinophils Absolute: 64 {cells}/uL (ref 15–500)
Eosinophils Relative: 0.7 %
HCT: 47.1 % (ref 38.5–50.0)
Hemoglobin: 15.8 g/dL (ref 13.2–17.1)
MCH: 30.8 pg (ref 27.0–33.0)
MCHC: 33.5 g/dL (ref 32.0–36.0)
MCV: 91.8 fL (ref 80.0–100.0)
MPV: 10.4 fL (ref 7.5–12.5)
Monocytes Relative: 4.7 %
Neutro Abs: 6325 {cells}/uL (ref 1500–7800)
Neutrophils Relative %: 69.5 %
Platelets: 198 Thousand/uL (ref 140–400)
RBC: 5.13 Million/uL (ref 4.20–5.80)
RDW: 11.8 % (ref 11.0–15.0)
Total Lymphocyte: 24.9 %
WBC: 9.1 Thousand/uL (ref 3.8–10.8)

## 2024-08-05 LAB — IRON,TIBC AND FERRITIN PANEL
%SAT: 17 % — ABNORMAL LOW (ref 20–48)
Ferritin: 102 ng/mL (ref 38–380)
Iron: 102 ug/dL (ref 50–380)
TIBC: 327 ug/dL (ref 250–425)

## 2024-08-05 LAB — VITAMIN D 25 HYDROXY (VIT D DEFICIENCY, FRACTURES): Vit D, 25-Hydroxy: 16 ng/mL — ABNORMAL LOW (ref 30–100)

## 2024-08-05 LAB — RPR: RPR Ser Ql: NONREACTIVE

## 2024-08-05 LAB — HIV-1 RNA QUANT-NO REFLEX-BLD
HIV 1 RNA Quant: NOT DETECTED {copies}/mL
HIV-1 RNA Quant, Log: NOT DETECTED {Log_copies}/mL

## 2024-08-05 LAB — STRONGYLOIDES ANTIBODY: Strongyloides IgG Antibody, ELISA: NEGATIVE

## 2024-08-07 ENCOUNTER — Ambulatory Visit: Admitting: Infectious Diseases

## 2024-10-05 ENCOUNTER — Other Ambulatory Visit: Payer: Self-pay | Admitting: Infectious Diseases

## 2024-10-05 DIAGNOSIS — Z21 Asymptomatic human immunodeficiency virus [HIV] infection status: Secondary | ICD-10-CM
